# Patient Record
Sex: Male | Born: 1975 | Hispanic: No | Marital: Married | Smoking: Never smoker
Health system: Southern US, Community
[De-identification: ages and names within clinical notes are randomized; demographics above are authoritative.]

## PROBLEM LIST (undated history)

## (undated) DIAGNOSIS — T7840XA Allergy, unspecified, initial encounter: Secondary | ICD-10-CM

## (undated) HISTORY — PX: OTHER SURGICAL HISTORY: SHX169

## (undated) HISTORY — DX: Allergy, unspecified, initial encounter: T78.40XA

## (undated) HISTORY — PX: NASAL SEPTUM SURGERY: SHX37

---

## 1997-05-16 HISTORY — PX: OTHER SURGICAL HISTORY: SHX169

## 2011-05-05 ENCOUNTER — Encounter (INDEPENDENT_AMBULATORY_CARE_PROVIDER_SITE_OTHER): Payer: Managed Care, Other (non HMO) | Admitting: Family Medicine

## 2011-05-05 DIAGNOSIS — Z0000 Encounter for general adult medical examination without abnormal findings: Secondary | ICD-10-CM

## 2011-05-05 DIAGNOSIS — L259 Unspecified contact dermatitis, unspecified cause: Secondary | ICD-10-CM

## 2011-05-05 DIAGNOSIS — K625 Hemorrhage of anus and rectum: Secondary | ICD-10-CM

## 2011-05-05 DIAGNOSIS — Z23 Encounter for immunization: Secondary | ICD-10-CM

## 2012-06-08 ENCOUNTER — Ambulatory Visit (INDEPENDENT_AMBULATORY_CARE_PROVIDER_SITE_OTHER): Payer: Managed Care, Other (non HMO) | Admitting: Emergency Medicine

## 2012-06-08 VITALS — BP 122/78 | HR 56 | Temp 97.9°F | Resp 16 | Ht 71.0 in | Wt 195.0 lb

## 2012-06-08 DIAGNOSIS — L309 Dermatitis, unspecified: Secondary | ICD-10-CM

## 2012-06-08 DIAGNOSIS — Z0000 Encounter for general adult medical examination without abnormal findings: Secondary | ICD-10-CM

## 2012-06-08 DIAGNOSIS — K648 Other hemorrhoids: Secondary | ICD-10-CM

## 2012-06-08 LAB — POCT CBC
Granulocyte percent: 57.8 %G (ref 37–80)
HCT, POC: 51.1 % (ref 43.5–53.7)
Hemoglobin: 16.5 g/dL (ref 14.1–18.1)
MCV: 92.9 fL (ref 80–97)
POC LYMPH PERCENT: 35.2 %L (ref 10–50)
RDW, POC: 12.6 %

## 2012-06-08 LAB — POCT URINALYSIS DIPSTICK
Blood, UA: NEGATIVE
Ketones, UA: NEGATIVE
Protein, UA: NEGATIVE
Spec Grav, UA: 1.01
pH, UA: 7

## 2012-06-08 LAB — LIPID PANEL
Cholesterol: 200 mg/dL (ref 0–200)
Total CHOL/HDL Ratio: 3.6 Ratio
Triglycerides: 119 mg/dL (ref <150)
VLDL: 24 mg/dL (ref 0–40)

## 2012-06-08 LAB — COMPREHENSIVE METABOLIC PANEL
BUN: 11 mg/dL (ref 6–23)
CO2: 28 mEq/L (ref 19–32)
Calcium: 9.4 mg/dL (ref 8.4–10.5)
Chloride: 104 mEq/L (ref 96–112)
Creat: 0.81 mg/dL (ref 0.50–1.35)
Glucose, Bld: 88 mg/dL (ref 70–99)
Total Bilirubin: 1 mg/dL (ref 0.3–1.2)

## 2012-06-08 LAB — POCT UA - MICROSCOPIC ONLY
Bacteria, U Microscopic: NEGATIVE
Crystals, Ur, HPF, POC: NEGATIVE
Epithelial cells, urine per micros: NEGATIVE
Mucus, UA: NEGATIVE
RBC, urine, microscopic: NEGATIVE

## 2012-06-08 MED ORDER — TRIAMCINOLONE ACETONIDE 0.1 % EX CREA: TOPICAL_CREAM | Freq: Two times a day (BID) | CUTANEOUS | Status: AC

## 2012-06-08 NOTE — Patient Instructions (Addendum)

## 2012-06-08 NOTE — Progress Notes (Signed)
[facility] [address] [place] [address] [phone]  Date:  06/08/2012   Name:  Lenis Nettleton   DOB:  [birth year]   MRN:  [id number]  PCP:  No primary provider on file.    Chief Complaint: Employment Physical   History of Present Illness:  Yael Angerer is a 37 y.o. very pleasant male patient who presents with the following:  For wellness examination.  Only current health concern is occasional internal bleeding hemorrhoids.  Had a flu shot.  Works for [employer] [place] and [employer]. Nonsmoker.  There is no problem list on file for this patient.   History reviewed. No pertinent past medical history.  Past Surgical History  Procedure Date  . Tosillectomy 1999    History  Substance Use Topics  . Smoking status: Never Smoker   . Smokeless tobacco: Not on file  . Alcohol Use: Not on file    Family History  Problem Relation Age of Onset  . Hypertension Father   . Hyperlipidemia Father   . Hyperlipidemia Brother   . Hypertension Brother     No Known Allergies  Medication list has been reviewed and updated.  No current outpatient prescriptions on file prior to visit.    Review of Systems:  As per HPI, otherwise negative.    Physical Examination: Filed Vitals:   06/08/12 0942  BP: 122/78  Pulse: 56  Temp: 97.9 F (36.6 C)  Resp: 16   Filed Vitals:   06/08/12 0942  Height: 5\' 11"  (1.803 m)  Weight: 195 lb (88.451 kg)   Body mass index is 27.20 kg/(m^2). Ideal Body Weight: Weight in (lb) to have BMI = 25: 178.9   GEN: WDWN, NAD, Non-toxic, A & O x 3 HEENT: Atraumatic, Normocephalic. Neck supple. No masses, No LAD. Ears and Nose: No external deformity. CV: RRR, No M/G/R. No JVD. No thrill. No extra heart sounds. PULM: CTA B, no wheezes, crackles, rhonchi. No retractions. No resp. distress. No accessory muscle use. ABD: S, NT, ND, +BS. No rebound. No HSM. EXTR: No c/c/e NEURO Normal gait.  PSYCH: Normally interactive. Conversant.  Not depressed or anxious appearing.  Calm demeanor.    Assessment and Plan: Wellness exam Labs Follow up after labs  Carmelina Dane, MD

## 2012-06-09 ENCOUNTER — Encounter: Payer: Self-pay | Admitting: Radiology

## 2012-07-27 ENCOUNTER — Ambulatory Visit: Payer: Managed Care, Other (non HMO) | Admitting: Emergency Medicine

## 2012-07-27 ENCOUNTER — Ambulatory Visit: Payer: Managed Care, Other (non HMO)

## 2012-07-27 VITALS — BP 132/72 | HR 68 | Temp 98.2°F | Resp 16 | Ht 71.0 in | Wt 199.0 lb

## 2012-07-27 DIAGNOSIS — S83412A Sprain of medial collateral ligament of left knee, initial encounter: Secondary | ICD-10-CM

## 2012-07-27 DIAGNOSIS — S83419A Sprain of medial collateral ligament of unspecified knee, initial encounter: Secondary | ICD-10-CM

## 2012-07-27 DIAGNOSIS — M25562 Pain in left knee: Secondary | ICD-10-CM

## 2012-07-27 MED ORDER — HYDROCODONE-ACETAMINOPHEN 5-325 MG PO TABS: 1.0000 | ORAL_TABLET | Freq: Four times a day (QID) | ORAL | Status: AC | PRN

## 2012-07-27 MED ORDER — MELOXICAM 15 MG PO TABS: 15.0000 mg | ORAL_TABLET | Freq: Every day | ORAL | Status: AC

## 2012-07-27 NOTE — Progress Notes (Signed)
  Subjective:    Patient ID: Jerry Contreras, male    DOB: 24-May-1975, 37 y.o.   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 They hit him on the lateral side of his left knee. Has pain with twisting side to side. In office has a onset of cramping in his left knee.   Review of Systems     Objective:   Physical Exam has tenderness over the medial joint space. There is laxity present over the medial collateral ligament. There is a negative anterior drawer sign. There is no fluid on the joint.  [facility] reading (PRIMARY) by  Dr Cleta Alberts  neg for fracture  - fit and trained for left hinged knee brace       Assessment & Plan:  Patient's exam is consistent with a medial collateral ligament tear. We'll recheck in about 10 days. Make a decision at that time of need for physical therapy referral or MRI.

## 2012-07-28 ENCOUNTER — Telehealth: Payer: Self-pay

## 2012-07-28 NOTE — Telephone Encounter (Signed)
Pt is wanting to

## 2012-08-06 ENCOUNTER — Ambulatory Visit (INDEPENDENT_AMBULATORY_CARE_PROVIDER_SITE_OTHER): Payer: Managed Care, Other (non HMO) | Admitting: Emergency Medicine

## 2012-08-06 VITALS — BP 110/98 | HR 58 | Temp 98.2°F | Resp 16 | Ht 71.0 in | Wt 199.0 lb

## 2012-08-06 DIAGNOSIS — M25569 Pain in unspecified knee: Secondary | ICD-10-CM

## 2012-08-06 DIAGNOSIS — M25562 Pain in left knee: Secondary | ICD-10-CM

## 2012-08-06 NOTE — Progress Notes (Signed)
  Subjective:    Patient ID: Jerry Contreras, male    DOB: Jun 29, 1975, 38 y.o.   MRN: [id number]  HPI patient in to recheck tear left medial collateral ligament. He is doing better with decreased pain his discomfort is now about a 3/10    Review of Systems     Objective:   Physical Exam There is about a 1 cm opening of the stressing of the medial collateral ligament. There is minimal joint line tenderness. There is no effusion. There is a negative drawer sign.       Assessment & Plan:  We'll refer to Dr. Althea Charon in [facility] orthopedics for evaluation and plan physical therapy to

## 2012-08-07 ENCOUNTER — Telehealth: Payer: Self-pay

## 2012-08-07 NOTE — Telephone Encounter (Signed)
Thanks, he is advised. No squatting climbing or kneeling and no lifting over 15lbs. I told him Dolores Frame will call once papers are done.

## 2012-08-07 NOTE — Telephone Encounter (Signed)
The paperwork is up on Maudias desk. The patient is not to do any squatting climbing and no lifting over 15 pound.

## 2012-08-07 NOTE — Telephone Encounter (Signed)
Pt wants to know what his restrictions are.  He is supposed to go to his second job tonight.  Dr. Cleta Alberts was working on paperwork for this patient.    [phone]

## 2013-01-11 ENCOUNTER — Encounter: Payer: Self-pay | Admitting: Emergency Medicine

## 2013-03-21 ENCOUNTER — Other Ambulatory Visit: Payer: Self-pay

## 2013-04-24 IMAGING — CR DG KNEE COMPLETE 4+V*L*
4 series · 4 of 4 positions shown · non-contrast
Comparison: None.

CLINICAL DATA: History of injury of lateral side of the.  History
of pain.

LEFT KNEE - COMPLETE 4+ VIEW

[AP]
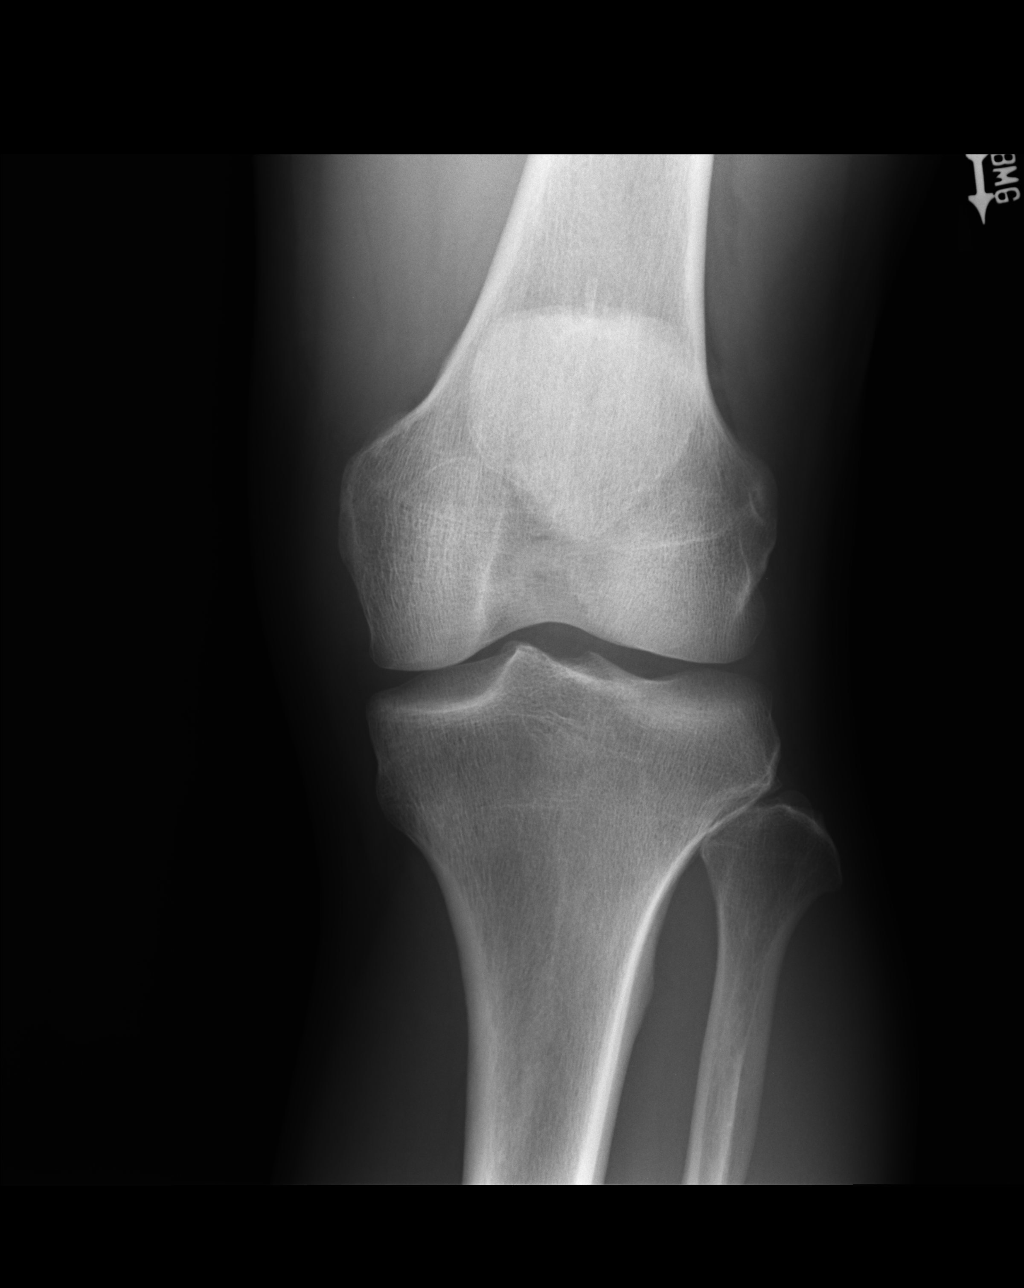

[ap axial]
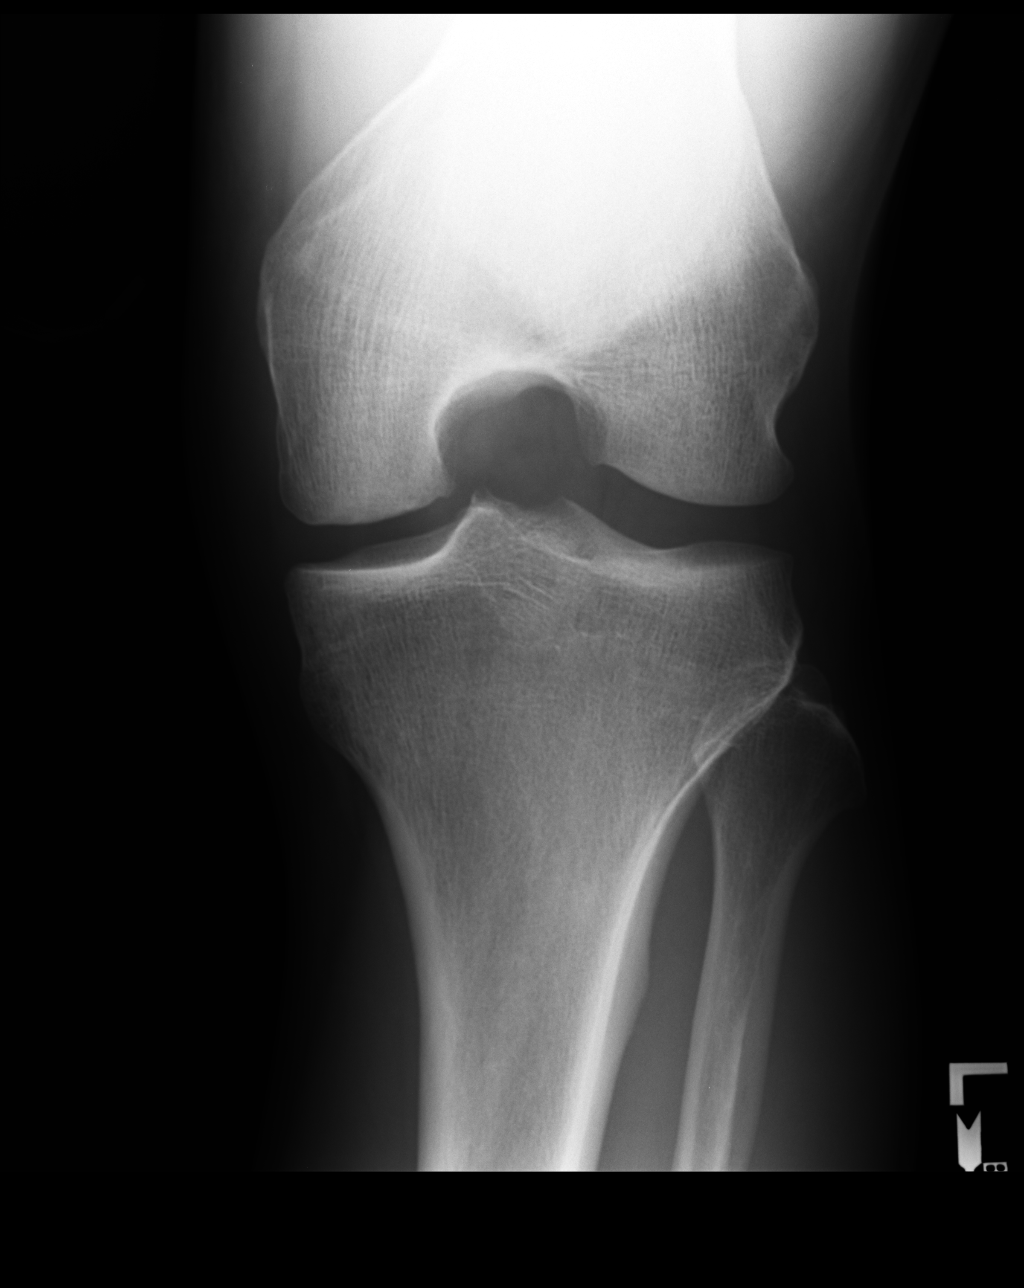

[lateral]
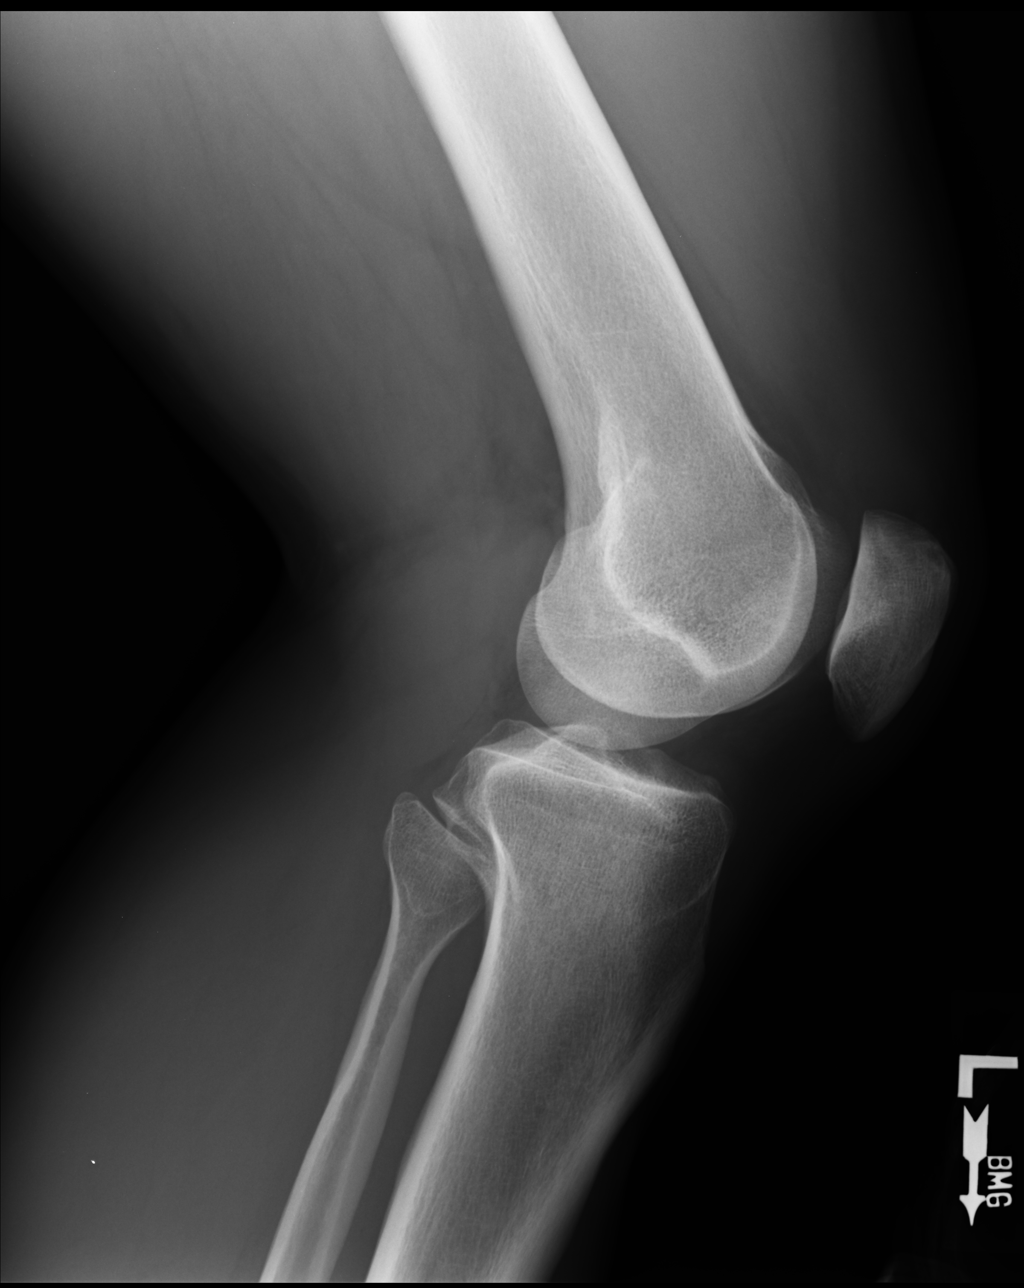

[sunrise]
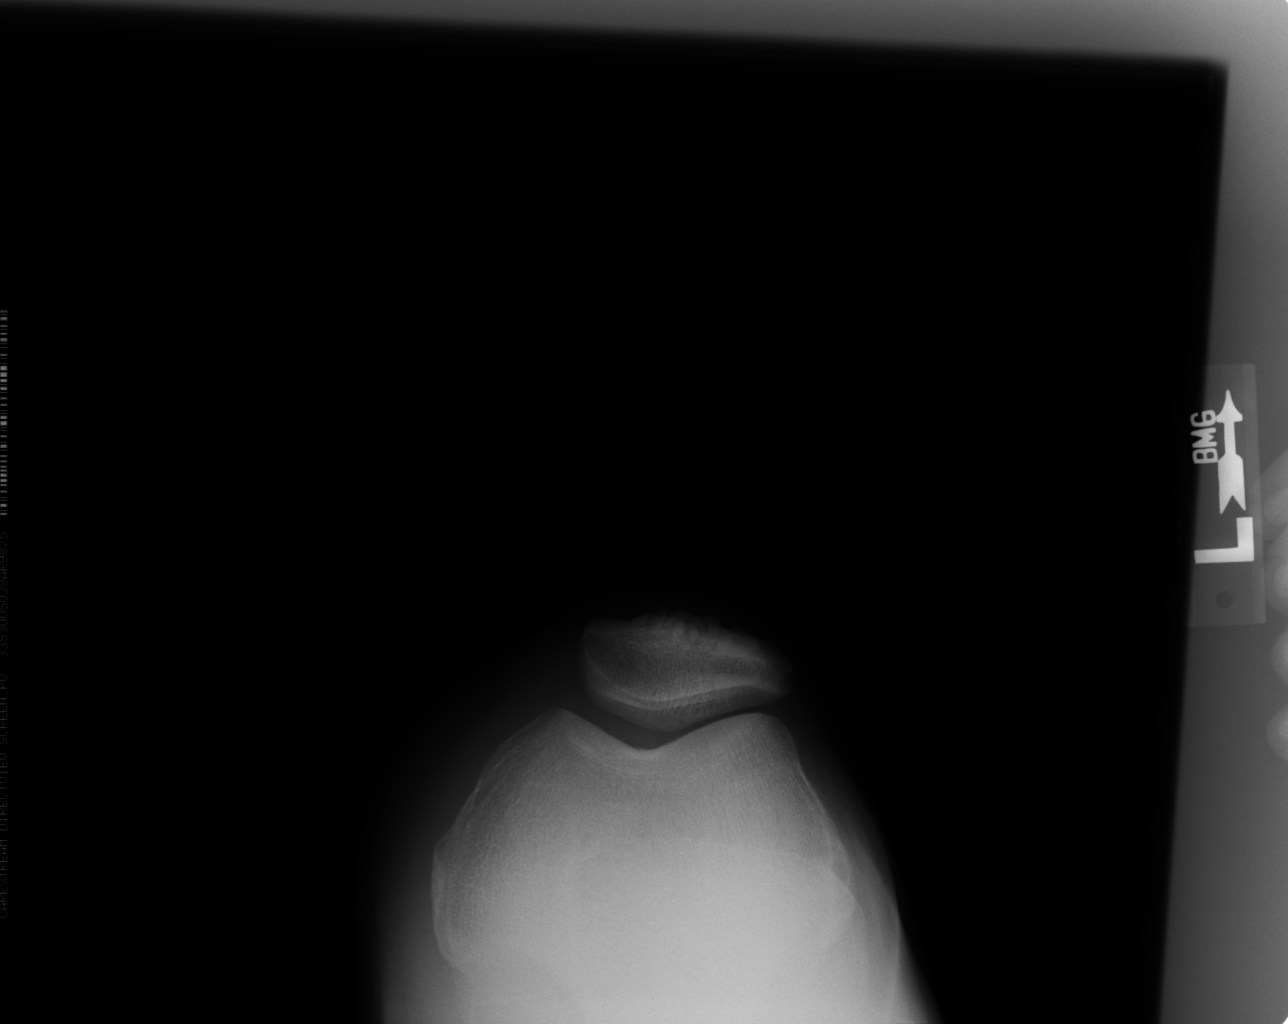

[4 of 4 positions shown; findings below may reference images not displayed]

FINDINGS: No definite joint effusion is seen. Alignment is normal.
Joint spaces are preserved.  No fracture or dislocation is evident.
No soft tissue lesions are seen.
IMPRESSION: No abnormality is evident.

## 2014-03-18 ENCOUNTER — Ambulatory Visit: Payer: 59

## 2014-11-22 ENCOUNTER — Ambulatory Visit (INDEPENDENT_AMBULATORY_CARE_PROVIDER_SITE_OTHER): Payer: 59 | Admitting: Emergency Medicine

## 2014-11-22 VITALS — BP 110/80 | HR 74 | Temp 98.3°F | Resp 16 | Ht 71.0 in | Wt 212.0 lb

## 2014-11-22 DIAGNOSIS — L259 Unspecified contact dermatitis, unspecified cause: Secondary | ICD-10-CM

## 2014-11-22 DIAGNOSIS — R21 Rash and other nonspecific skin eruption: Secondary | ICD-10-CM | POA: Diagnosis not present

## 2014-11-22 DIAGNOSIS — Z23 Encounter for immunization: Secondary | ICD-10-CM

## 2014-11-22 LAB — POCT SKIN KOH: SKIN KOH, POC: NEGATIVE

## 2014-11-22 MED ORDER — PREDNISONE 20 MG PO TABS: ORAL_TABLET | ORAL | Status: AC

## 2014-11-22 MED ORDER — DOXYCYCLINE HYCLATE 100 MG PO CAPS: 100.0000 mg | ORAL_CAPSULE | Freq: Two times a day (BID) | ORAL | Status: AC

## 2014-11-22 NOTE — Progress Notes (Signed)
[facility] [address], [place] [id number] [phone]  Date:  11/22/2014   Name:  Jerry Contreras   DOB:  [birth year]   MRN:  [id number]  PCP:  No PCP Per Patient    History of Present Illness:  Jerry PomfretJohn Daino is a 39 y.o. male patient who [facility] for chief complaint of rash along upper back, left leg, arms, armpit, and scalp. Left shin, he noticed a somewhat circular darkened patch 4-6 weeks ago.  He placed hydrocortisone in the area, and it would seem to diminish, but then reappear over the weeks.  However 2 weeks ago, it begin to expand.  He thought it was eczema.  Once he started placing tea tree oil, the rash begin to expanded and he noticed that it was more itchy.  He continued with the tea tree oil, this time occluding it with gauze and bandaging.  In the last 5 days, he noticed, that it has begin to swell and weep.  He became more concerned when his ankle started to swell yesterday.  He has no fever, nausea, sob, or numbness or tingling of his left leg.  He has not had any known tick bites, body aches, or fatigue.      He's been dealing with this rash for the last 2 years. He was diagnosed by Dr. Dareen PianoAnderson with eczema and given triamcinolone which he placed along the scalp. He notes that this had helped significantly with his scapular rash. He also noted that certain shampoos were better and did not cause flareup of the rash, but now the rash. The upper back rash appeared about a week ago after he was out in the sun. He noticed a sunburn after being at the pool, but since onset, he has had pruritus along the tops of his shoulders. He notes that he has not used any treatment on this area besides the hydrocortisone as well.  He notes there is rash also at his armpit as well.   There has been no diet change.  He has no joint pains.  No known allergies.  He has no fam hx of rheumatologic arthritis or GI disorder.     There are no active problems to display for this  patient.   Past Medical History  Diagnosis Date  . Allergy     Past Surgical History  Procedure Laterality Date  . Tosillectomy  1999  . Deviat    . Nasal septum surgery      History  Substance Use Topics  . Smoking status: Never Smoker   . Smokeless tobacco: Never Used  . Alcohol Use: Yes    Family History  Problem Relation Age of Onset  . Hypertension Father   . Hyperlipidemia Father   . Hyperlipidemia Brother   . Hypertension Brother     No Known Allergies  Medication list has been reviewed and updated.  Current Outpatient Prescriptions on File Prior to Visit  Medication Sig Dispense Refill  . Probiotic Product (PROBIOTIC DAILY PO) Take by mouth.    Marland Kitchen. HYDROcodone-acetaminophen (NORCO) 5-325 MG per tablet Take 1 tablet by mouth every 6 (six) hours as needed for pain. (Patient not taking: Reported on 11/22/2014) 20 tablet 0  . meloxicam (MOBIC) 15 MG tablet Take 1 tablet (15 mg total) by mouth daily. (Patient not taking: Reported on 11/22/2014) 30 tablet 1  . triamcinolone cream (KENALOG) 0.1 % Apply topically 2 (two) times daily. (Patient not taking: Reported on 11/22/2014)  45 g 2   No current facility-administered medications on file prior to visit.    ROS ROS otherwise unremarkable unless listed above.  Physical Examination: BP 110/80 mmHg  Pulse 74  Temp(Src) 98.3 F (36.8 C) (Oral)  Resp 16  Ht  (1.803 m)  Wt 212 lb (96.163 kg)  BMI 29.58 kg/m2  SpO2 98% Ideal Body Weight: Weight in (lb) to have BMI = 25: 178.9  Physical Exam Alert, cooperative, oriented 4. PERRLA, mildly injected left conjunctiva.  Ear nose and throat within normal mucosa and findings. No lymphadenopathy detected. Breath sounds are normal without wheezing or rhonchi. Regular rate and rhythm without murmurs or rubs. Mildly raised erythematous scaly a symmetric patches along the scalp. More pronounced along back of head. No hair loss detected. Along the shoulder, erythematous  maculopapular rash with minimal flaking. Left axilla has erythematous papules along follicle. Left lower leg raised erythematous 15 cm rash along the circumference of the leg.  Within that rash is a smaller linearly square rash, consistent with gauze area with tea tree oil that is more erythematous raised and weeping. Left ankle swollen with normal capillary refill and DP pulse.  Assessment and Plan: 39 year old male with past medical history as above is here today for chief complaint of rash. He has a dermatology appointment within 7 days. Left leg appears to be an allergic rxn from the tea tree oil.  Initial lesion possibly his eczema.   -Wound culture taken. He is given a prednisone taper and doxycycline. Scalp is an ongoing issue and we will have dermatology address this, as well as the left leg. We will not treat it topically at this time to not skew the diagnosis for the specialist. We have alerted him on alarming symptoms that would warrant immediate return.  -We have advised him to clean the leg with soap and water twice a day and pat dry, avoiding the tea tree oil. Future injections to avoid prednisone compromise. -shoulder rash likely photosensitivity to rash.  Advised to stay out of the sun prior to dermatologist.    Rash of unknown cause - Plan: POCT Skin KOH, Hepatitis A vaccine adult IM, Hepatitis B vaccine adult IM, Tdap vaccine greater than or equal to 7yo IM, doxycycline (VIBRAMYCIN) 100 MG capsule, predniSONE (DELTASONE) 20 MG tablet, Wound culture  Need for Tdap vaccination - Plan: Tdap vaccine greater than or equal to 7yo IM  Need for hepatitis A immunization - Plan: Hepatitis A vaccine adult IM  Need for hepatitis B vaccination - Plan: Hepatitis B vaccine adult IM  Contact dermatitis - Plan: doxycycline (VIBRAMYCIN) 100 MG capsule, predniSONE (DELTASONE) 20 MG tablet   Trena Platt, PA-C [facility] 11/22/2014 9:13  AM

## 2014-11-22 NOTE — Patient Instructions (Signed)
Please take the medication as prescribed. Please wash the leg with unscented hypoallergenic soap twice per day. We will let the dermatologist suggest scalp treatment.   Please stay out of the sun at this time.

## 2014-11-24 LAB — WOUND CULTURE
GRAM STAIN: NONE SEEN
GRAM STAIN: NONE SEEN
Gram Stain: NONE SEEN

## 2014-12-03 ENCOUNTER — Encounter: Payer: Self-pay | Admitting: Family Medicine

## 2018-01-24 ENCOUNTER — Ambulatory Visit: Payer: 59 | Admitting: Emergency Medicine

## 2019-03-16 ENCOUNTER — Telehealth: Payer: Self-pay | Admitting: Nurse Practitioner

## 2019-03-16 DIAGNOSIS — Z9189 Other specified personal risk factors, not elsewhere classified: Secondary | ICD-10-CM

## 2019-03-16 NOTE — Progress Notes (Signed)
E-Visit for Corona Virus Screening   Your current symptoms could be consistent with the coronavirus.  Many health care providers can now test patients at their office but not all are.  [facility] has multiple testing sites. For information on our COVID testing locations and hours go to [email or web address]  Please quarantine yourself while awaiting your test results.  We are enrolling you in our [place] for [place] . Daily you will receive a questionnaire within the [facility] website. Our COVID 19 response team willl be monitoriing your responses daily.  You can go to one of the  testing sites listed below, while they are opened (see hours). You do not need a doctors order to be tested for covid.You do need to self-isolate until your results return and if positive 14 days from when your symptoms started and until you are 3 days symptom free.   Testing Locations (Monday - Friday, 8 a.m. - 3:30 p.m.)   [place]: [facility] Entrance), [address] [place], [place]: [address], [place], [place], [place] (entrance off [address] (Closed each Monday): [address] [place], [place] - the short stay covered drive at [facility] (Use the [employer] entrance to [facility] next to [place] is a respiratory illness with symptoms that are similar to the flu. Symptoms are typically mild to moderate, but there have been cases of severe illness and death due to the virus. The following symptoms may appear 2-14 days after exposure: . Fever . Cough . Shortness of breath or difficulty breathing . Chills . Repeated shaking with chills . Muscle pain . Headache . Sore throat . New loss of taste or smell . Fatigue . Congestion or runny nose . Nausea or vomiting . Diarrhea  It is vitally  important that if you feel that you have an infection such as this virus or any other virus that you stay home and away from places where you may spread it to others.  You should self-quarantine for 14 days if you have symptoms that could potentially be coronavirus or have been in close contact a with a person diagnosed with COVID-19 within the last 2 weeks. You should avoid contact with people age 41 and older.   You should wear a mask or cloth face covering over your nose and mouth if you must be around other people or animals, including pets (even at home). Try to stay at least 6 feet away from other people. This will protect the people around you.  You can use medication such as delsym or robitussin OTC for cough.  You may also take acetaminophen (Tylenol) as needed for fever.   Reduce your risk of any infection by using the same precautions used for avoiding the common cold or flu:  Marland Kitchen Wash your hands often with soap and warm water for at least 20 seconds.  If soap and water are not readily available, use an alcohol-based hand sanitizer with at least 60% alcohol.  . If coughing or sneezing, cover your mouth and nose by coughing or sneezing into the elbow areas of your shirt or coat, into a tissue or into your sleeve (not your hands). . Avoid shaking hands with others and consider head nods or verbal greetings only. . Avoid touching your eyes, nose, or mouth with unwashed hands.  Marland Kitchen  Avoid close contact with people who are sick. . Avoid places or events with large numbers of people in one location, like concerts or sporting events. . Carefully consider travel plans you have or are making. . If you are planning any travel outside or inside the [place], visit the CDC's Travelers' Health webpage for the latest health notices. . If you have some symptoms but not all symptoms, continue to monitor at home and seek medical attention if your symptoms worsen. . If you are having a medical emergency, call  911.  HOME CARE . Only take medications as instructed by your medical team. . Drink plenty of fluids and get plenty of rest. . A steam or ultrasonic humidifier can help if you have congestion.   GET HELP RIGHT AWAY IF YOU HAVE EMERGENCY WARNING SIGNS** FOR COVID-19. If you or someone is showing any of these signs seek emergency medical care immediately. Call 911 or proceed to your closest emergency facility if: . You develop worsening high fever. . Trouble breathing . Bluish lips or face . Persistent pain or pressure in the chest . New confusion . Inability to wake or stay awake . You cough up blood. . Your symptoms become more severe  **This list is not all possible symptoms. Contact your medical provider for any symptoms that are sever or concerning to you.   MAKE SURE YOU   Understand these instructions.  Will watch your condition.  Will get help right away if you are not doing well or get worse.  Your e-visit answers were reviewed by a board certified advanced clinical practitioner to complete your personal care plan.  Depending on the condition, your plan could have included both over the counter or prescription medications.  If there is a problem please reply once you have received a response from your provider.  Your safety is important to [place].  If you have drug allergies check your prescription carefully.    You can use MyChart to ask questions about today's visit, request a non-urgent call back, or ask for a work or school excuse for 24 hours related to this e-Visit. If it has been greater than 24 hours you will need to follow up with your provider, or enter a new e-Visit to address those concerns. You will get an e-mail in the next two days asking about your experience.  I hope that your e-visit has been valuable and will speed your recovery. Thank you for using e-visits.   5-10 minutes spent reviewing and documenting in chart.
